# Patient Record
Sex: Male | Born: 1980 | Race: Black or African American | Hispanic: No | Marital: Single | Smoking: Current every day smoker
Health system: Southern US, Community
[De-identification: ages and names within clinical notes are randomized; demographics above are authoritative.]

---

## 2005-03-10 ENCOUNTER — Emergency Department: Payer: Self-pay | Admitting: Emergency Medicine

## 2005-08-30 ENCOUNTER — Emergency Department: Payer: Self-pay | Admitting: Emergency Medicine

## 2007-10-04 ENCOUNTER — Emergency Department: Payer: Self-pay | Admitting: Unknown Physician Specialty

## 2009-06-06 ENCOUNTER — Emergency Department: Payer: Self-pay | Admitting: Emergency Medicine

## 2011-05-25 ENCOUNTER — Emergency Department: Payer: Self-pay | Admitting: Emergency Medicine

## 2014-12-04 ENCOUNTER — Emergency Department: Payer: Self-pay | Admitting: Emergency Medicine

## 2018-10-23 ENCOUNTER — Emergency Department: Payer: Managed Care, Other (non HMO)

## 2018-10-23 ENCOUNTER — Emergency Department
Admission: EM | Admit: 2018-10-23 | Discharge: 2018-10-23 | Disposition: A | Discharge status: Home / Self Care | Payer: Managed Care, Other (non HMO) | Attending: Emergency Medicine | Admitting: Emergency Medicine

## 2018-10-23 ENCOUNTER — Encounter: Payer: Self-pay | Admitting: Emergency Medicine

## 2018-10-23 DIAGNOSIS — R591 Generalized enlarged lymph nodes: Secondary | ICD-10-CM

## 2018-10-23 DIAGNOSIS — E876 Hypokalemia: Secondary | ICD-10-CM

## 2018-10-23 DIAGNOSIS — F17210 Nicotine dependence, cigarettes, uncomplicated: Secondary | ICD-10-CM | POA: Diagnosis not present

## 2018-10-23 DIAGNOSIS — R599 Enlarged lymph nodes, unspecified: Secondary | ICD-10-CM | POA: Insufficient documentation

## 2018-10-23 DIAGNOSIS — M542 Cervicalgia: Secondary | ICD-10-CM | POA: Diagnosis present

## 2018-10-23 DIAGNOSIS — J449 Chronic obstructive pulmonary disease, unspecified: Secondary | ICD-10-CM | POA: Diagnosis not present

## 2018-10-23 DIAGNOSIS — J984 Other disorders of lung: Secondary | ICD-10-CM

## 2018-10-23 LAB — CBC
HCT: 41.3 % (ref 39.0–52.0)
Hemoglobin: 14.4 g/dL (ref 13.0–17.0)
MCH: 31.9 pg (ref 26.0–34.0)
MCHC: 34.9 g/dL (ref 30.0–36.0)
MCV: 91.6 fL (ref 80.0–100.0)
NRBC: 0 % (ref 0.0–0.2)
Platelets: 237 10*3/uL (ref 150–400)
RBC: 4.51 MIL/uL (ref 4.22–5.81)
RDW: 14.6 % (ref 11.5–15.5)
WBC: 9.2 10*3/uL (ref 4.0–10.5)

## 2018-10-23 LAB — BASIC METABOLIC PANEL
Anion gap: 8 (ref 5–15)
BUN: 10 mg/dL (ref 6–20)
CO2: 28 mmol/L (ref 22–32)
Calcium: 9.3 mg/dL (ref 8.9–10.3)
Chloride: 102 mmol/L (ref 98–111)
Creatinine, Ser: 0.91 mg/dL (ref 0.61–1.24)
Glucose, Bld: 96 mg/dL (ref 70–99)
Potassium: 3.4 mmol/L — ABNORMAL LOW (ref 3.5–5.1)
Sodium: 138 mmol/L (ref 135–145)

## 2018-10-23 LAB — GROUP A STREP BY PCR: Group A Strep by PCR: NOT DETECTED

## 2018-10-23 LAB — INFLUENZA PANEL BY PCR (TYPE A & B)
Influenza A By PCR: NEGATIVE
Influenza B By PCR: NEGATIVE

## 2018-10-23 MED ORDER — PREDNISONE 10 MG PO TABS: ORAL_TABLET | ORAL | 0 refills | Status: DC

## 2018-10-23 MED ORDER — POTASSIUM CHLORIDE CRYS ER 20 MEQ PO TBCR
40.0000 meq | EXTENDED_RELEASE_TABLET | Freq: Once | ORAL | Status: AC
  Administered 2018-10-23: 40 meq via ORAL
  Filled 2018-10-23: qty 2

## 2018-10-23 MED ORDER — AMOXICILLIN-POT CLAVULANATE 875-125 MG PO TABS: 1.0000 | ORAL_TABLET | Freq: Two times a day (BID) | ORAL | 0 refills | Status: AC

## 2018-10-23 MED ORDER — IOHEXOL 300 MG/ML  SOLN
75.0000 mL | Freq: Once | INTRAMUSCULAR | Status: AC | PRN
  Administered 2018-10-23: 75 mL via INTRAVENOUS
  Filled 2018-10-23: qty 75

## 2018-10-23 NOTE — ED Triage Notes (Signed)
Patient presents to the ED with neck pain x 2 days.  Patient states pain is on the left side of his neck and he feels area is swollen and warm.  This RN palpated neck but it was not super obvious.  Patient is in no obvious distress at this time.  Patient states he has pain when he swallows and when he palpates neck.

## 2018-10-23 NOTE — ED Notes (Addendum)
See triage note  Presents with pain to neck for about 3 days  Denies any injury or trauma   States woke up with pain  States pain is mainly to left side of neck  Feels warm to touch per pt  Low grade feer on arrival   States he took some tylenol PTA feels better

## 2018-10-23 NOTE — ED Provider Notes (Signed)
[facility] Emergency Department Provider Note  ____________________________________________  Time seen: Approximately 4:22 PM  I have reviewed the triage vital signs and the nursing notes.   HISTORY  Chief Complaint Neck Pain    HPI Ivan Coleman is a 37 y.o. male that presents emergency department for evaluation of left-sided neck pain for 2 days.  He says that neck is felt warm and swollen.  Patient states that pain started when he woke up yesterday morning.  He states that pain is worse with swallowing.  He had pain in his throat with swallowing earlier today but that resolved with Tylenol.  He is not sure if he has had a fever but has had chills so he has been wearing a jacket.  Pain is worse when he moves his head in any direction.  He has had on and off headache all day.  History reviewed. No pertinent past medical history.  There are no active problems to display for this patient.   History reviewed. No pertinent surgical history.  Prior to Admission medications   Medication Sig Start Date End Date Taking? Authorizing Provider  amoxicillin-clavulanate (AUGMENTIN) 875-125 MG tablet Take 1 tablet by mouth 2 (two) times daily for 10 days. 10/23/18 11/02/18  Enid Derry, PA-C  predniSONE (DELTASONE) 10 MG tablet Take 6 tablets on day 1, take 5 tablets on day 2, take 4 tablets on day 3, take 3 tablets on day 4, take 2 tablets on day 5, take 1 tablet on day 6 10/23/18   Enid Derry, PA-C    Allergies Patient has no known allergies.  No family history on file.  Social History Social History   Tobacco Use  . Smoking status: Current Every Day Smoker    Packs/day: 0.50    Types: Cigarettes  . Smokeless tobacco: Never Used  Substance Use Topics  . Alcohol use: Not Currently  . Drug use: Not on file     Review of Systems  Constitutional: Positive for chills. ENT: No upper respiratory complaints. Cardiovascular: No chest pain. Respiratory:  No cough. No SOB. Gastrointestinal: No abdominal pain.  No nausea, no vomiting.  Musculoskeletal: Positive for neck pain. Skin: Negative for rash, abrasions, lacerations, ecchymosis. Neurological: Negative for headaches, numbness or tingling   ____________________________________________   PHYSICAL EXAM:  VITAL SIGNS: ED Triage Vitals  Enc Vitals Group     BP 10/23/18 1546 (!) 145/100     Pulse Rate 10/23/18 1546 85     Resp 10/23/18 1546 18     Temp 10/23/18 1546 99 F (37.2 C)     Temp Source 10/23/18 1546 Oral     SpO2 10/23/18 1546 99 %     Weight 10/23/18 1545 135 lb (61.2 kg)     Height 10/23/18 1545 5\' 1"  (1.549 m)     Head Circumference --      Peak Flow --      Pain Score 10/23/18 1545 6     Pain Loc --      Pain Edu? --      Excl. in GC? --      Constitutional: Alert and oriented. Well appearing and in no acute distress. Eyes: Conjunctivae are normal. PERRL. EOMI. Head: Atraumatic. ENT:      Ears:      Nose: No congestion/rhinnorhea.      Mouth/Throat: Mucous membranes are moist.  No facial swelling. Neck: No stridor.  No cervical spine tenderness to palpation.  Tenderness to palpation over left  sternocleidomastoid.  No palpable lymph node.  Pain with range of motion of neck.  No bruits. Cardiovascular: Normal rate, regular rhythm.  Good peripheral circulation. Respiratory: Normal respiratory effort without tachypnea or retractions. Lungs CTAB. Good air entry to the bases with no decreased or absent breath sounds. Musculoskeletal: Full range of motion to all extremities. No gross deformities appreciated.  No pain elicited with resisted flexion and extension of shoulders. Neurologic:  Normal speech and language. No gross focal neurologic deficits are appreciated.  Skin:  Skin is warm, dry and intact. No rash noted. Psychiatric: Mood and affect are normal. Speech and behavior are normal. Patient exhibits appropriate insight and  judgement.   ____________________________________________   LABS (all labs ordered are listed, but only abnormal results are displayed)  Labs Reviewed  BASIC METABOLIC PANEL - Abnormal; Notable for the following components:      Result Value   Potassium 3.4 (*)    All other components within normal limits  GROUP A STREP BY PCR  INFLUENZA PANEL BY PCR (TYPE A & B)  CBC   ____________________________________________  EKG   ____________________________________________  RADIOLOGY   Ct Soft Tissue Neck W Contrast  Result Date: 10/23/2018 CLINICAL DATA:  37 y/o M; 2 days of left-sided neck pain with swelling and warmth. EXAM: CT NECK WITH CONTRAST TECHNIQUE: Multidetector CT imaging of the neck was performed using the standard protocol following the bolus administration of intravenous contrast. CONTRAST:  75mL OMNIPAQUE IOHEXOL 300 MG/ML  SOLN COMPARISON:  None. FINDINGS: Pharynx and larynx: Normal. No mass or swelling. Salivary glands: No inflammation, mass, or stone. Thyroid: Normal. Lymph nodes: Mild left upper and lower cervical lymphadenopathy with surrounding fat stranding. No lymph node necrosis. Vascular: Negative. Limited intracranial: Negative. Visualized orbits: Negative. Mastoids and visualized paranasal sinuses: Clear. Skeleton: No acute or aggressive process. Upper chest: Numerous centrilobular ground-glass nodules in the lung apices. Biapical bulla. Other: Dental disease with periapical cysts of maxillary molars. Debris within the external auditory canals, likely cerumen. IMPRESSION: 1. Mild left upper and lower cervical lymphadenopathy with surrounding fat stranding. No abscess or lymph node necrosis. Findings likely represent reactive lymphadenitis. Differential includes, but is not limited to, autoimmune disease, lymphoproliferative disorder such as lymphoma, or atypical infection such as tuberculosis or cat scratch disease. Clinical correlation and follow-up to resolution is  recommended. 2. Numerous centrilobular ground-glass nodules in the lung apices, likely smoking related respiratory bronchiolitis. Electronically Signed   By: Mitzi Hansen M.D.   On: 10/23/2018 18:00    ____________________________________________    PROCEDURES  Procedure(s) performed:    Procedures    Medications  iohexol (OMNIPAQUE) 300 MG/ML solution 75 mL (75 mLs Intravenous Contrast Given 10/23/18 1729)  potassium chloride SA (K-DUR,KLOR-CON) CR tablet 40 mEq (40 mEq Oral Given 10/23/18 1910)     ____________________________________________   INITIAL IMPRESSION / ASSESSMENT AND PLAN / ED COURSE  Pertinent labs & imaging results that were available during my care of the patient were reviewed by me and considered in my medical decision making (see chart for details).  Review of the [place] CSRS was performed in accordance of the [facility] prior to dispensing any controlled drugs.     Patient's diagnosis is consistent with lymphadenopathy.  Vital signs and exam are reassuring.  CMP remarkable for potassium of 3.4, which was supplemented.  CT neck soft tissue indicates nonspecific lymphadenitis.  Patient denies any additional symptoms.  He will be given a prescription for Augmentin and prednisone to trial.  CT shows groundglass  nodules that patient will follow up with primary care about.  Patient is agreeable to follow-up with primary care for further work-up of lymphadenitis.  Patient will be discharged home with prescriptions for Augmentin and prednisone. Patient is to follow up with primary care as directed. Patient is given ED precautions to return to the ED for any worsening or new symptoms.     ____________________________________________  FINAL CLINICAL IMPRESSION(S) / ED DIAGNOSES  Final diagnoses:  Lymphadenopathy  Lung disease  Hypokalemia      NEW MEDICATIONS STARTED DURING THIS VISIT:  ED Discharge Orders         Ordered    amoxicillin-clavulanate  (AUGMENTIN) 875-125 MG tablet  2 times daily     10/23/18 1914    predniSONE (DELTASONE) 10 MG tablet     10/23/18 1914              This chart was dictated using voice recognition software/Dragon. Despite best efforts to proofread, errors can occur which can change the meaning. Any change was purely unintentional.    Enid DerryWagner, Saira Kramme, PA-C 10/24/18 Deniece Portela0020    [place], [place], MD 10/24/18 [phone]

## 2018-10-23 NOTE — Discharge Instructions (Addendum)
Please make an appointment with primary care for follow-up of lymphadenopathy and to ensure resolution.

## 2018-10-23 NOTE — ED Notes (Signed)
Pt discharged home after verbalizing understanding of discharge instructions; nad noted. 

## 2020-02-26 ENCOUNTER — Emergency Department: Payer: BC Managed Care – PPO

## 2020-02-26 ENCOUNTER — Other Ambulatory Visit: Payer: Self-pay

## 2020-02-26 ENCOUNTER — Emergency Department
Admission: EM | Admit: 2020-02-26 | Discharge: 2020-02-26 | Disposition: A | Discharge status: Home / Self Care | Payer: BC Managed Care – PPO | Attending: Emergency Medicine | Admitting: Emergency Medicine

## 2020-02-26 DIAGNOSIS — R1032 Left lower quadrant pain: Secondary | ICD-10-CM | POA: Diagnosis not present

## 2020-02-26 DIAGNOSIS — F17210 Nicotine dependence, cigarettes, uncomplicated: Secondary | ICD-10-CM | POA: Insufficient documentation

## 2020-02-26 DIAGNOSIS — G8929 Other chronic pain: Secondary | ICD-10-CM | POA: Diagnosis not present

## 2020-02-26 DIAGNOSIS — M25511 Pain in right shoulder: Secondary | ICD-10-CM | POA: Insufficient documentation

## 2020-02-26 DIAGNOSIS — R1030 Lower abdominal pain, unspecified: Secondary | ICD-10-CM

## 2020-02-26 LAB — COMPREHENSIVE METABOLIC PANEL
ALT: 14 U/L (ref 0–44)
AST: 14 U/L — ABNORMAL LOW (ref 15–41)
Albumin: 4.4 g/dL (ref 3.5–5.0)
Alkaline Phosphatase: 62 U/L (ref 38–126)
Anion gap: 10 (ref 5–15)
BUN: 11 mg/dL (ref 6–20)
CO2: 26 mmol/L (ref 22–32)
Calcium: 9.5 mg/dL (ref 8.9–10.3)
Chloride: 102 mmol/L (ref 98–111)
Creatinine, Ser: 0.97 mg/dL (ref 0.61–1.24)
GFR calc Af Amer: >60 mL/min (ref >60)
GFR calc non Af Amer: >60 mL/min (ref >60)
Glucose, Bld: 97 mg/dL (ref 70–99)
Potassium: 3.8 mmol/L (ref 3.5–5.1)
Sodium: 138 mmol/L (ref 135–145)
Total Bilirubin: 0.8 mg/dL (ref 0.3–1.2)
Total Protein: 8.4 g/dL — ABNORMAL HIGH (ref 6.5–8.1)

## 2020-02-26 LAB — CBC
HCT: 44.4 % (ref 39.0–52.0)
Hemoglobin: 15.3 g/dL (ref 13.0–17.0)
MCH: 31.5 pg (ref 26.0–34.0)
MCHC: 34.5 g/dL (ref 30.0–36.0)
MCV: 91.5 fL (ref 80.0–100.0)
Platelets: 309 10*3/uL (ref 150–400)
RBC: 4.85 MIL/uL (ref 4.22–5.81)
RDW: 14.5 % (ref 11.5–15.5)
WBC: 9 10*3/uL (ref 4.0–10.5)
nRBC: 0 % (ref 0.0–0.2)

## 2020-02-26 LAB — LIPASE, BLOOD: Lipase: 22 U/L (ref 11–51)

## 2020-02-26 LAB — URINALYSIS, COMPLETE (UACMP) WITH MICROSCOPIC
Bacteria, UA: NONE SEEN
Bilirubin Urine: NEGATIVE
Glucose, UA: NEGATIVE mg/dL
Hgb urine dipstick: NEGATIVE
Ketones, ur: NEGATIVE mg/dL
Nitrite: NEGATIVE
Protein, ur: NEGATIVE mg/dL
Specific Gravity, Urine: 1.026 (ref 1.005–1.030)
pH: 7 (ref 5.0–8.0)

## 2020-02-26 MED ORDER — IBUPROFEN 800 MG PO TABS: 800.0000 mg | ORAL_TABLET | Freq: Three times a day (TID) | ORAL | 0 refills | Status: DC | PRN

## 2020-02-26 MED ORDER — IBUPROFEN 800 MG PO TABS
800.0000 mg | ORAL_TABLET | Freq: Once | ORAL | Status: AC
  Administered 2020-02-26: 800 mg via ORAL
  Filled 2020-02-26: qty 1

## 2020-02-26 MED ORDER — SODIUM CHLORIDE 0.9% FLUSH: 3.0000 mL | Freq: Once | INTRAVENOUS | Status: DC

## 2020-02-26 MED ORDER — DIAZEPAM 5 MG PO TABS: 5.0000 mg | ORAL_TABLET | Freq: Three times a day (TID) | ORAL | 0 refills | Status: DC | PRN

## 2020-02-26 NOTE — ED Provider Notes (Signed)
[facility] Emergency Department Provider Note       Time seen: ----------------------------------------- 10:47 AM on 02/26/2020 -----------------------------------------   I have reviewed the triage vital signs and the nursing notes.  HISTORY   Chief Complaint Shoulder Pain and Abdominal Pain   HPI Ivan Coleman is a 39 y.o. male with no past medical history who presents to the ED for right shoulder pain that he has had intermittently for several years that flared up last week.  Initially it started after he tried to dunk a basketball.  He is also having some lower abdominal pain without nausea, vomiting or diarrhea.  History reviewed. No pertinent past medical history.  There are no problems to display for this patient.   History reviewed. No pertinent surgical history.  Allergies Patient has no known allergies.  Social History Social History   Tobacco Use  . Smoking status: Current Every Day Smoker    Packs/day: 0.50    Types: Cigarettes  . Smokeless tobacco: Never Used  Substance Use Topics  . Alcohol use: Not Currently  . Drug use: Not on file    Review of Systems Constitutional: Negative for fever. Cardiovascular: Negative for chest pain. Respiratory: Negative for shortness of breath. Gastrointestinal: Positive for abdominal pain Musculoskeletal: Positive for right shoulder pain Skin: Negative for rash. Neurological: Negative for headaches, focal weakness or numbness.  All systems negative/normal/unremarkable except as stated in the HPI  ____________________________________________   PHYSICAL EXAM:  VITAL SIGNS: ED Triage Vitals  Enc Vitals Group     BP 02/26/20 0806 134/89     Pulse Rate 02/26/20 0806 88     Resp 02/26/20 0806 16     Temp 02/26/20 0806 98.5 F (36.9 C)     Temp Source 02/26/20 0806 Oral     SpO2 02/26/20 0806 99 %     Weight 02/26/20 0808 150 lb (68 kg)     Height 02/26/20 0808 5\' 1"  (1.549 m)      Head Circumference --      Peak Flow --      Pain Score 02/26/20 0808 6     Pain Loc --      Pain Edu? --      Excl. in GC? --     Constitutional: Alert and oriented. Well appearing and in no distress. Eyes: Conjunctivae are normal. Normal extraocular movements. ENT      Head: Normocephalic and atraumatic.      Nose: No congestion/rhinnorhea.      Mouth/Throat: Mucous membranes are moist.      Neck: No stridor. Cardiovascular: Normal rate, regular rhythm. No murmurs, rubs, or gallops. Respiratory: Normal respiratory effort without tachypnea nor retractions. Breath sounds are clear and equal bilaterally. No wheezes/rales/rhonchi. Gastrointestinal: Left lower quadrant tenderness, no rebound or guarding.  Normal bowel sounds. Musculoskeletal: Mild pain with range of motion of the right shoulder Neurologic:  Normal speech and language. No gross focal neurologic deficits are appreciated.  Skin:  Skin is warm, dry and intact. No rash noted. Psychiatric: Mood and affect are normal. Speech and behavior are normal.  ___________________________________________  ED COURSE:  As part of my medical decision making, I reviewed the following data within the electronic MEDICAL RECORD NUMBER History obtained from family if available, nursing notes, old chart and ekg, as well as notes from prior ED visits. Patient presented for shoulder pain as well as abdominal pain, we will assess with labs and imaging as indicated at this time.  Procedures  Ivan Coleman was evaluated in Emergency Department on 02/26/2020 for the symptoms described in the history of present illness. He was evaluated in the context of the global COVID-19 pandemic, which necessitated consideration that the patient might be at risk for infection with the SARS-CoV-2 virus that causes COVID-19. Institutional protocols and algorithms that pertain to the evaluation of patients at risk for COVID-19 are in a state of rapid change based on  information released by regulatory bodies including the CDC and federal and state organizations. These policies and algorithms were followed during the patient's care in the ED.  ____________________________________________   LABS (pertinent positives/negatives)  Labs Reviewed  COMPREHENSIVE METABOLIC PANEL - Abnormal; Notable for the following components:      Result Value   Total Protein 8.4 (*)    AST 14 (*)    All other components within normal limits  URINALYSIS, COMPLETE (UACMP) WITH MICROSCOPIC - Abnormal; Notable for the following components:   Color, Urine YELLOW (*)    APPearance HAZY (*)    Leukocytes,Ua SMALL (*)    All other components within normal limits  URINE CULTURE  LIPASE, BLOOD  CBC    RADIOLOGY Images were viewed by me  Right shoulder x-ray, CT renal protocol IMPRESSION: No urolithiasis or obstructive uropathy. IMPRESSION: Negative. ____________________________________________   DIFFERENTIAL DIAGNOSIS   Renal colic, UTI, pyelonephritis, muscle strain, arthritis, rotator cuff injury  FINAL ASSESSMENT AND PLAN  Shoulder pain, abdominal pain   Plan: The patient had presented for shoulder pain and abdominal pain that have both been intermittent. Patient's labs were reassuring. Patient's imaging did not reveal any acute process.  Patient has borderline evidence for UTI of uncertain significance.  We have sent a urine culture.  He will be discharged with NSAIDs, muscle relaxants and close outpatient follow-up.   Laurence Aly, MD    Note: This note was generated in part or whole with voice recognition software. Voice recognition is usually quite accurate but there are transcription errors that can and very often do occur. I apologize for any typographical errors that were not detected and corrected.     Earleen Newport, MD 02/26/20 1220

## 2020-02-26 NOTE — ED Triage Notes (Signed)
Pt c/o right shoulder pain flared up in the past week. Pt also c/o lower abd pain without N/V/D.Marland Kitchen

## 2020-02-27 LAB — URINE CULTURE: Culture: NO GROWTH

## 2020-10-04 ENCOUNTER — Emergency Department: Payer: 59

## 2020-10-04 ENCOUNTER — Other Ambulatory Visit: Payer: Self-pay

## 2020-10-04 ENCOUNTER — Emergency Department
Admission: EM | Admit: 2020-10-04 | Discharge: 2020-10-04 | Disposition: A | Discharge status: Home / Self Care | Payer: 59 | Attending: Student in an Organized Health Care Education/Training Program | Admitting: Student in an Organized Health Care Education/Training Program

## 2020-10-04 ENCOUNTER — Encounter: Payer: Self-pay | Admitting: Emergency Medicine

## 2020-10-04 DIAGNOSIS — H6123 Impacted cerumen, bilateral: Secondary | ICD-10-CM | POA: Insufficient documentation

## 2020-10-04 DIAGNOSIS — F17210 Nicotine dependence, cigarettes, uncomplicated: Secondary | ICD-10-CM | POA: Diagnosis not present

## 2020-10-04 DIAGNOSIS — R1032 Left lower quadrant pain: Secondary | ICD-10-CM | POA: Diagnosis present

## 2020-10-04 LAB — CBC WITH DIFFERENTIAL/PLATELET
Abs Immature Granulocytes: 0.02 10*3/uL (ref 0.00–0.07)
Basophils Absolute: 0.1 10*3/uL (ref 0.0–0.1)
Basophils Relative: 1 %
Eosinophils Absolute: 0.2 10*3/uL (ref 0.0–0.5)
Eosinophils Relative: 3 %
HCT: 40.7 % (ref 39.0–52.0)
Hemoglobin: 13.9 g/dL (ref 13.0–17.0)
Immature Granulocytes: 0 %
Lymphocytes Relative: 32 %
Lymphs Abs: 2.2 10*3/uL (ref 0.7–4.0)
MCH: 31.3 pg (ref 26.0–34.0)
MCHC: 34.2 g/dL (ref 30.0–36.0)
MCV: 91.7 fL (ref 80.0–100.0)
Monocytes Absolute: 0.7 10*3/uL (ref 0.1–1.0)
Monocytes Relative: 10 %
Neutro Abs: 3.7 10*3/uL (ref 1.7–7.7)
Neutrophils Relative %: 54 %
Platelets: 294 10*3/uL (ref 150–400)
RBC: 4.44 MIL/uL (ref 4.22–5.81)
RDW: 14.2 % (ref 11.5–15.5)
WBC: 6.9 10*3/uL (ref 4.0–10.5)
nRBC: 0 % (ref 0.0–0.2)

## 2020-10-04 LAB — COMPREHENSIVE METABOLIC PANEL
ALT: 15 U/L (ref 0–44)
AST: 16 U/L (ref 15–41)
Albumin: 4 g/dL (ref 3.5–5.0)
Alkaline Phosphatase: 62 U/L (ref 38–126)
Anion gap: 8 (ref 5–15)
BUN: 10 mg/dL (ref 6–20)
CO2: 28 mmol/L (ref 22–32)
Calcium: 9.1 mg/dL (ref 8.9–10.3)
Chloride: 101 mmol/L (ref 98–111)
Creatinine, Ser: 0.95 mg/dL (ref 0.61–1.24)
GFR, Estimated: >60 mL/min (ref >60)
Glucose, Bld: 103 mg/dL — ABNORMAL HIGH (ref 70–99)
Potassium: 4.1 mmol/L (ref 3.5–5.1)
Sodium: 137 mmol/L (ref 135–145)
Total Bilirubin: 0.6 mg/dL (ref 0.3–1.2)
Total Protein: 7.7 g/dL (ref 6.5–8.1)

## 2020-10-04 LAB — URINALYSIS, COMPLETE (UACMP) WITH MICROSCOPIC
Bacteria, UA: NONE SEEN
Bilirubin Urine: NEGATIVE
Glucose, UA: NEGATIVE mg/dL
Hgb urine dipstick: NEGATIVE
Ketones, ur: NEGATIVE mg/dL
Leukocytes,Ua: NEGATIVE
Nitrite: NEGATIVE
Protein, ur: NEGATIVE mg/dL
Specific Gravity, Urine: 1.021 (ref 1.005–1.030)
Squamous Epithelial / HPF: NONE SEEN (ref 0–5)
pH: 7 (ref 5.0–8.0)

## 2020-10-04 MED ORDER — ONDANSETRON HCL 4 MG/2ML IJ SOLN
4.0000 mg | Freq: Once | INTRAMUSCULAR | Status: AC
  Administered 2020-10-04: 4 mg via INTRAVENOUS
  Filled 2020-10-04: qty 2

## 2020-10-04 MED ORDER — MORPHINE SULFATE (PF) 4 MG/ML IV SOLN
4.0000 mg | INTRAVENOUS | Status: DC | PRN
  Administered 2020-10-04: 4 mg via INTRAVENOUS
  Filled 2020-10-04: qty 1

## 2020-10-04 MED ORDER — SODIUM CHLORIDE 0.9 % IV BOLUS
1000.0000 mL | Freq: Once | INTRAVENOUS | Status: AC
  Administered 2020-10-04: 1000 mL via INTRAVENOUS

## 2020-10-04 MED ORDER — CYCLOBENZAPRINE HCL 5 MG PO TABS: 5.0000 mg | ORAL_TABLET | Freq: Three times a day (TID) | ORAL | 0 refills | Status: DC | PRN

## 2020-10-04 MED ORDER — DEBROX 6.5 % OT SOLN: 5.0000 [drp] | Freq: Two times a day (BID) | OTIC | 0 refills | Status: AC

## 2020-10-04 NOTE — Discharge Instructions (Addendum)

## 2020-10-04 NOTE — ED Notes (Signed)
Pt given cup and is aware of need for urine sample

## 2020-10-04 NOTE — ED Notes (Signed)
Reminded pt of need for urine sample 

## 2020-10-04 NOTE — ED Triage Notes (Signed)
Pt to ER with c/o LLQ abdominal pain starting 3 days ago.  Pt denies n/v or other s/s at this time.

## 2020-10-04 NOTE — ED Notes (Signed)
Pt taken for CT 

## 2020-10-04 NOTE — ED Provider Notes (Signed)
[facility] Emergency Department Provider Note    First MD Initiated Contact with Patient 10/04/20 0840     (approximate)  I have reviewed the triage vital signs and the nursing notes.   HISTORY  Chief Complaint Abdominal Pain    HPI Ivan Coleman is a 39 y.o. male   presents to the ER for several days of left lower quadrant abdominal pain is worse with movement.  States he will get in a hot bath to try and help it.  Sometimes the pain will bit on his groin and sometimes just in the left side.  Does work lifting heavy buckets.  Denies any diarrhea.  No fevers or chills.  Is never had pain like this before.  No history of kidney stones.   History reviewed. No pertinent past medical history. History reviewed. No pertinent family history. History reviewed. No pertinent surgical history. There are no problems to display for this patient.     Prior to Admission medications   Medication Sig Start Date End Date Taking? Authorizing Provider  carbamide peroxide (DEBROX) 6.5 % OTIC solution Place 5 drops into both ears 2 (two) times daily. 10/04/20   Willy Eddy, MD  cyclobenzaprine (FLEXERIL) 5 MG tablet Take 1 tablet (5 mg total) by mouth 3 (three) times daily as needed for muscle spasms. 10/04/20   Willy Eddy, MD  diazepam (VALIUM) 5 MG tablet Take 1 tablet (5 mg total) by mouth every 8 (eight) hours as needed for muscle spasms. 02/26/20   Emily Filbert, MD  ibuprofen (ADVIL) 800 MG tablet Take 1 tablet (800 mg total) by mouth every 8 (eight) hours as needed. 02/26/20   Emily Filbert, MD    Allergies Patient has no known allergies.    Social History Social History   Tobacco Use  . Smoking status: Current Every Day Smoker    Packs/day: 0.50    Types: Cigarettes  . Smokeless tobacco: Never Used  Substance Use Topics  . Alcohol use: Not Currently  . Drug use: Not on file    Review of Systems Patient denies headaches,  rhinorrhea, blurry vision, numbness, shortness of breath, chest pain, edema, cough, abdominal pain, nausea, vomiting, diarrhea, dysuria, fevers, rashes or hallucinations unless otherwise stated above in HPI. ____________________________________________   PHYSICAL EXAM:  VITAL SIGNS: Vitals:   10/04/20 1130 10/04/20 1200  BP: 109/82 117/77  Pulse: (!) 51 (!) 56  Resp:  16  Temp:    SpO2: 100% 100%    Constitutional: Alert and oriented.  Eyes: Conjunctivae are normal.  Head: Atraumatic. Nose: No congestion/rhinnorhea. Mouth/Throat: Mucous membranes are moist.   Neck: No stridor. Painless ROM.  Cardiovascular: Normal rate, regular rhythm. Grossly normal heart sounds.  Good peripheral circulation. Respiratory: Normal respiratory effort.  No retractions. Lungs CTAB. Gastrointestinal: Soft with mild left lower quadrant abdominal pain no distention. No abdominal bruits. No CVA tenderness. Genitourinary: No hernia noted Musculoskeletal: No lower extremity tenderness nor edema.  No joint effusions. Neurologic:  Normal speech and language. No gross focal neurologic deficits are appreciated. No facial droop Skin:  Skin is warm, dry and intact. No rash noted. Psychiatric: Mood and affect are normal. Speech and behavior are normal.  ____________________________________________   LABS (all labs ordered are listed, but only abnormal results are displayed)  Results for orders placed or performed during the hospital encounter of 10/04/20 (from the past 24 hour(s))  CBC with Differential     Status: None   Collection Time:  10/04/20  9:13 AM  Result Value Ref Range   WBC 6.9 4.0 - 10.5 K/uL   RBC 4.44 4.22 - 5.81 MIL/uL   Hemoglobin 13.9 13.0 - 17.0 g/dL   HCT 97.9 39 - 52 %   MCV 91.7 80.0 - 100.0 fL   MCH 31.3 26.0 - 34.0 pg   MCHC 34.2 30.0 - 36.0 g/dL   RDW 89.2 11.9 - 41.7 %   Platelets 294 150 - 400 K/uL   nRBC 0.0 0.0 - 0.2 %   Neutrophils Relative % 54 %   Neutro Abs 3.7 1.7 -  7.7 K/uL   Lymphocytes Relative 32 %   Lymphs Abs 2.2 0.7 - 4.0 K/uL   Monocytes Relative 10 %   Monocytes Absolute 0.7 0.1 - 1.0 K/uL   Eosinophils Relative 3 %   Eosinophils Absolute 0.2 0.0 - 0.5 K/uL   Basophils Relative 1 %   Basophils Absolute 0.1 0.0 - 0.1 K/uL   Immature Granulocytes 0 %   Abs Immature Granulocytes 0.02 0.00 - 0.07 K/uL  Comprehensive metabolic panel     Status: Abnormal   Collection Time: 10/04/20  9:13 AM  Result Value Ref Range   Sodium 137 135 - 145 mmol/L   Potassium 4.1 3.5 - 5.1 mmol/L   Chloride 101 98 - 111 mmol/L   CO2 28 22 - 32 mmol/L   Glucose, Bld 103 (H) 70 - 99 mg/dL   BUN 10 6 - 20 mg/dL   Creatinine, Ser 4.08 0.61 - 1.24 mg/dL   Calcium 9.1 8.9 - 14.4 mg/dL   Total Protein 7.7 6.5 - 8.1 g/dL   Albumin 4.0 3.5 - 5.0 g/dL   AST 16 15 - 41 U/L   ALT 15 0 - 44 U/L   Alkaline Phosphatase 62 38 - 126 U/L   Total Bilirubin 0.6 0.3 - 1.2 mg/dL   GFR, Estimated >81 >85 mL/min   Anion gap 8 5 - 15  Urinalysis, Complete w Microscopic     Status: Abnormal   Collection Time: 10/04/20 10:56 AM  Result Value Ref Range   Color, Urine YELLOW (A) YELLOW   APPearance CLEAR (A) CLEAR   Specific Gravity, Urine 1.021 1.005 - 1.030   pH 7.0 5.0 - 8.0   Glucose, UA NEGATIVE NEGATIVE mg/dL   Hgb urine dipstick NEGATIVE NEGATIVE   Bilirubin Urine NEGATIVE NEGATIVE   Ketones, ur NEGATIVE NEGATIVE mg/dL   Protein, ur NEGATIVE NEGATIVE mg/dL   Nitrite NEGATIVE NEGATIVE   Leukocytes,Ua NEGATIVE NEGATIVE   RBC / HPF 0-5 0 - 5 RBC/hpf   WBC, UA 0-5 0 - 5 WBC/hpf   Bacteria, UA NONE SEEN NONE SEEN   Squamous Epithelial / LPF NONE SEEN 0 - 5   Mucus PRESENT    ____________________________________________ ____________________________________________  RADIOLOGY  I personally reviewed all radiographic images ordered to evaluate for the above acute complaints and reviewed radiology reports and findings.  These findings were personally discussed with the  patient.  Please see medical record for radiology report.  ____________________________________________   PROCEDURES  Procedure(s) performed:  Procedures    Critical Care performed: no ____________________________________________   INITIAL IMPRESSION / ASSESSMENT AND PLAN / ED COURSE  Pertinent labs & imaging results that were available during my care of the patient were reviewed by me and considered in my medical decision making (see chart for details).   DDX: Stone, hernia, musculoskeletal strain, UTI, diverticulitis, colitis  Ivan Coleman is a 39 y.o. who  presents to the ED with symptoms as described above.  Patient nontoxic-appearing is afebrile hemodynamically stable but is somewhat tender in left lower quadrant.  Will order blood work as well as imaging for the but differential will provide IV narcotic pain medication and IV antiemetic.  Clinical Course as of Oct 04 1521  Sat Oct 04, 2020  1157 Patient reassessed.  Abdominal exam is soft benign.  I suspect he has some sort of musculoskeletal injury from overuse injury at work.  Will provide prescription for muscle relaxant.  Also does appear to have bilateral cerumen impaction.  We discussed management of this.  I do not see indication for hospitalization he is otherwise well-appearing and appropriate for outpatient follow-up.   [PR]    Clinical Course User Index [PR] Willy Eddy, MD    The patient was evaluated in Emergency Department today for the symptoms described in the history of present illness. He/she was evaluated in the context of the global COVID-19 pandemic, which necessitated consideration that the patient might be at risk for infection with the SARS-CoV-2 virus that causes COVID-19. Institutional protocols and algorithms that pertain to the evaluation of patients at risk for COVID-19 are in a state of rapid change based on information released by regulatory bodies including the CDC and federal and state  organizations. These policies and algorithms were followed during the patient's care in the ED.  As part of my medical decision making, I reviewed the following data within the electronic MEDICAL RECORD NUMBER Nursing notes reviewed and incorporated, Labs reviewed, notes from prior ED visits and [place] Controlled Substance Database   ____________________________________________   FINAL CLINICAL IMPRESSION(S) / ED DIAGNOSES  Final diagnoses:  Abdominal pain, LLQ  Bilateral impacted cerumen      NEW MEDICATIONS STARTED DURING THIS VISIT:  Discharge Medication List as of 10/04/2020 12:00 PM    START taking these medications   Details  carbamide peroxide (DEBROX) 6.5 % OTIC solution Place 5 drops into both ears 2 (two) times daily., Starting Sat 10/04/2020, Normal    cyclobenzaprine (FLEXERIL) 5 MG tablet Take 1 tablet (5 mg total) by mouth 3 (three) times daily as needed for muscle spasms., Starting Sat 10/04/2020, Normal         Note:  This document was prepared using Dragon voice recognition software and may include unintentional dictation errors.    Willy Eddy, MD 10/04/20 [phone]

## 2020-11-16 ENCOUNTER — Encounter: Payer: Self-pay | Admitting: Emergency Medicine

## 2020-11-16 ENCOUNTER — Emergency Department
Admission: EM | Admit: 2020-11-16 | Discharge: 2020-11-16 | Disposition: A | Discharge status: Home / Self Care | Payer: 59 | Attending: Emergency Medicine | Admitting: Emergency Medicine

## 2020-11-16 ENCOUNTER — Other Ambulatory Visit: Payer: Self-pay

## 2020-11-16 DIAGNOSIS — R509 Fever, unspecified: Secondary | ICD-10-CM | POA: Diagnosis not present

## 2020-11-16 DIAGNOSIS — R300 Dysuria: Secondary | ICD-10-CM | POA: Insufficient documentation

## 2020-11-16 DIAGNOSIS — N50812 Left testicular pain: Secondary | ICD-10-CM | POA: Diagnosis not present

## 2020-11-16 DIAGNOSIS — R369 Urethral discharge, unspecified: Secondary | ICD-10-CM | POA: Insufficient documentation

## 2020-11-16 DIAGNOSIS — R1031 Right lower quadrant pain: Secondary | ICD-10-CM | POA: Insufficient documentation

## 2020-11-16 DIAGNOSIS — N50811 Right testicular pain: Secondary | ICD-10-CM | POA: Diagnosis not present

## 2020-11-16 DIAGNOSIS — Z5321 Procedure and treatment not carried out due to patient leaving prior to being seen by health care provider: Secondary | ICD-10-CM | POA: Diagnosis not present

## 2020-11-16 LAB — CBC
HCT: 38.6 % — ABNORMAL LOW (ref 39.0–52.0)
Hemoglobin: 13.4 g/dL (ref 13.0–17.0)
MCH: 31.3 pg (ref 26.0–34.0)
MCHC: 34.7 g/dL (ref 30.0–36.0)
MCV: 90.2 fL (ref 80.0–100.0)
Platelets: 321 10*3/uL (ref 150–400)
RBC: 4.28 MIL/uL (ref 4.22–5.81)
RDW: 13.8 % (ref 11.5–15.5)
WBC: 10.5 10*3/uL (ref 4.0–10.5)
nRBC: 0 % (ref 0.0–0.2)

## 2020-11-16 LAB — COMPREHENSIVE METABOLIC PANEL
ALT: 16 U/L (ref 0–44)
AST: 15 U/L (ref 15–41)
Albumin: 3.9 g/dL (ref 3.5–5.0)
Alkaline Phosphatase: 63 U/L (ref 38–126)
Anion gap: 10 (ref 5–15)
BUN: 9 mg/dL (ref 6–20)
CO2: 25 mmol/L (ref 22–32)
Calcium: 8.8 mg/dL — ABNORMAL LOW (ref 8.9–10.3)
Chloride: 99 mmol/L (ref 98–111)
Creatinine, Ser: 0.97 mg/dL (ref 0.61–1.24)
GFR, Estimated: >60 mL/min (ref >60)
Glucose, Bld: 117 mg/dL — ABNORMAL HIGH (ref 70–99)
Potassium: 3.3 mmol/L — ABNORMAL LOW (ref 3.5–5.1)
Sodium: 134 mmol/L — ABNORMAL LOW (ref 135–145)
Total Bilirubin: 0.5 mg/dL (ref 0.3–1.2)
Total Protein: 8.2 g/dL — ABNORMAL HIGH (ref 6.5–8.1)

## 2020-11-16 LAB — LACTIC ACID, PLASMA: Lactic Acid, Venous: 1.2 mmol/L (ref 0.5–1.9)

## 2020-11-16 LAB — URINALYSIS, COMPLETE (UACMP) WITH MICROSCOPIC
Bilirubin Urine: NEGATIVE
Glucose, UA: NEGATIVE mg/dL
Ketones, ur: NEGATIVE mg/dL
Nitrite: NEGATIVE
Protein, ur: 30 mg/dL — AB
Specific Gravity, Urine: 1.019 (ref 1.005–1.030)
Squamous Epithelial / HPF: NONE SEEN (ref 0–5)
WBC, UA: >50 WBC/hpf — ABNORMAL HIGH (ref 0–5)
pH: 6 (ref 5.0–8.0)

## 2020-11-16 LAB — LIPASE, BLOOD: Lipase: 20 U/L (ref 11–51)

## 2020-11-16 NOTE — ED Triage Notes (Signed)
Pt to ED via POV c/o painful urination. Pt states that he is also having pain in both of his testicles, and RLQ abd pain. Pt states that he also thinks he has some penile discharge. Pt states that he is having fever and chills as well. Pt is in NAD.

## 2021-07-17 IMAGING — CR DG SHOULDER 2+V*R*
3 series · 3 of 3 positions shown · non-contrast
Comparison: None.

CLINICAL DATA: Right-sided shoulder pain

EXAM:
RIGHT SHOULDER - 2+ VIEW

[shoulder grashey]
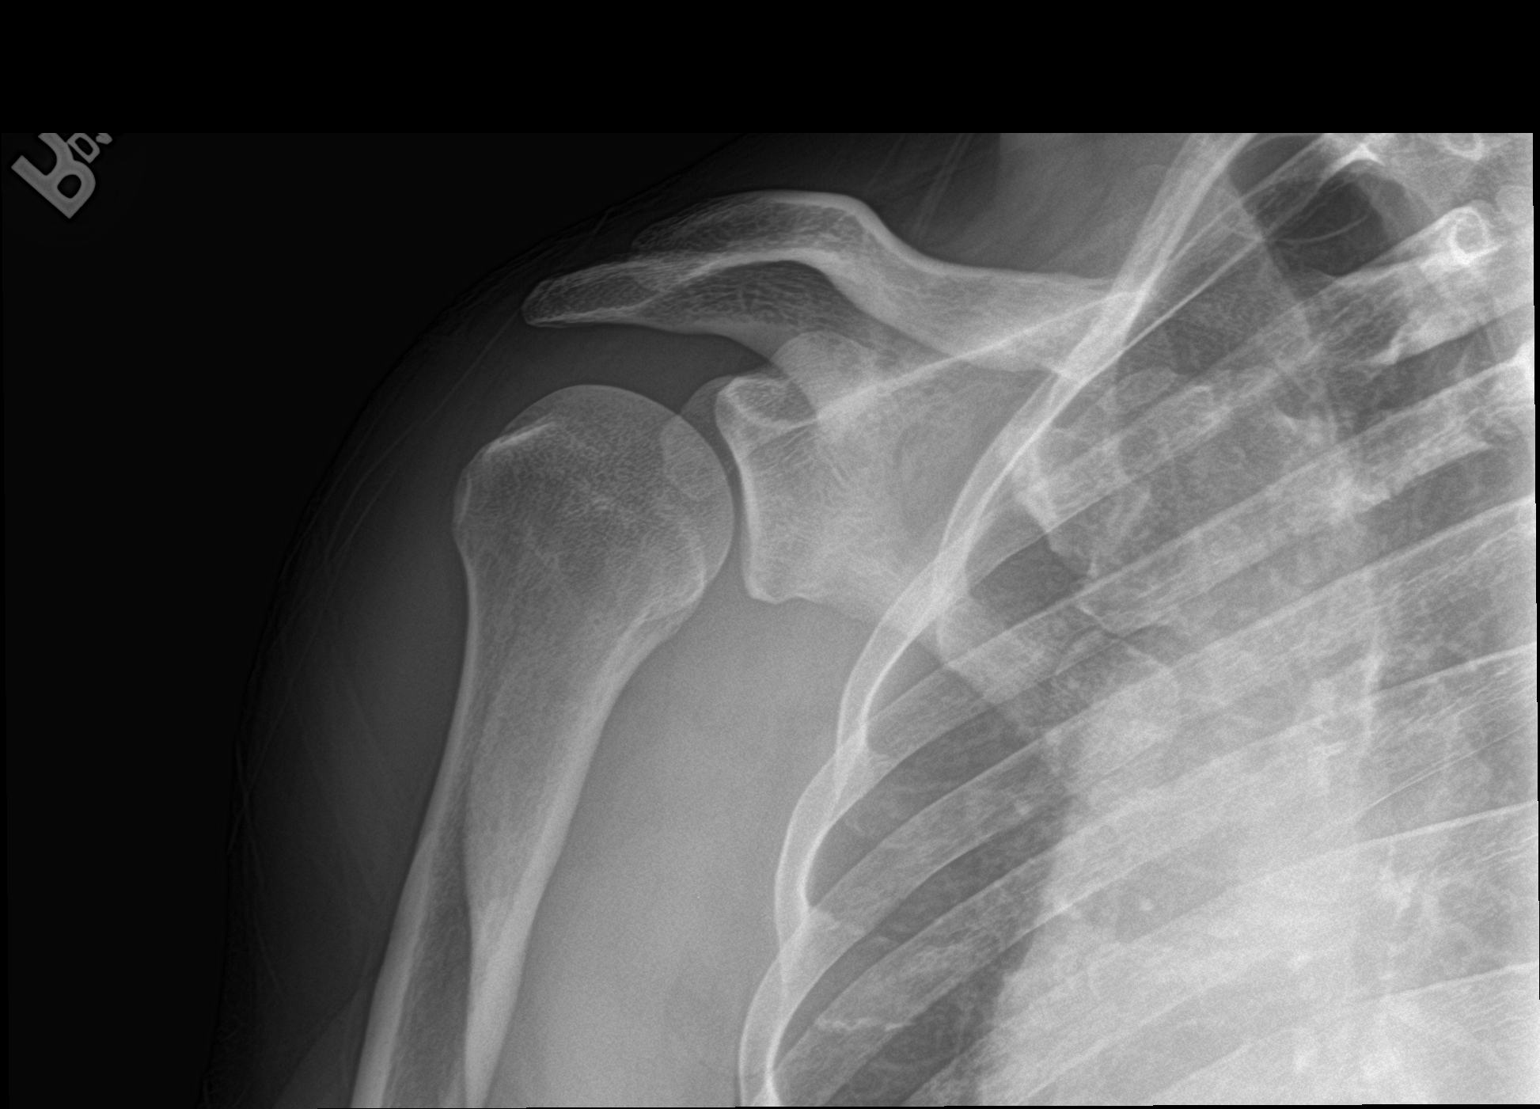

[shoulder axillary]
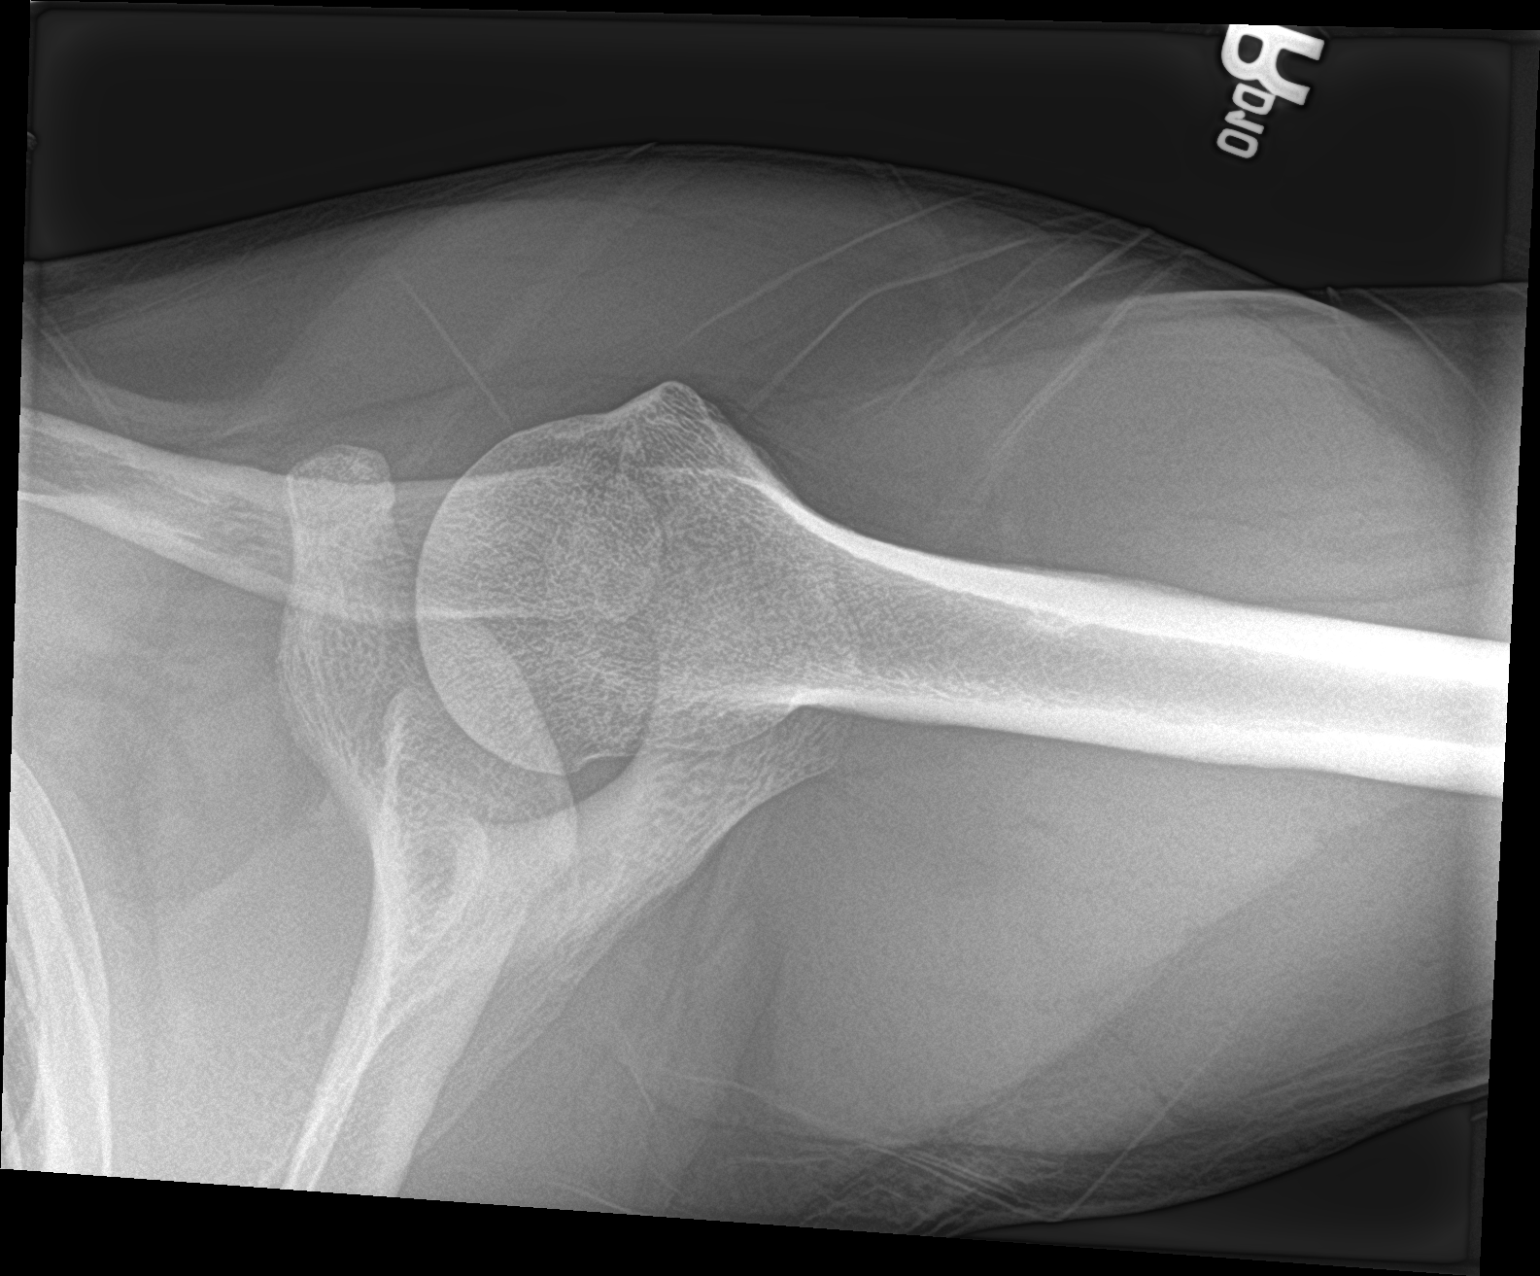

[shoulder y view]
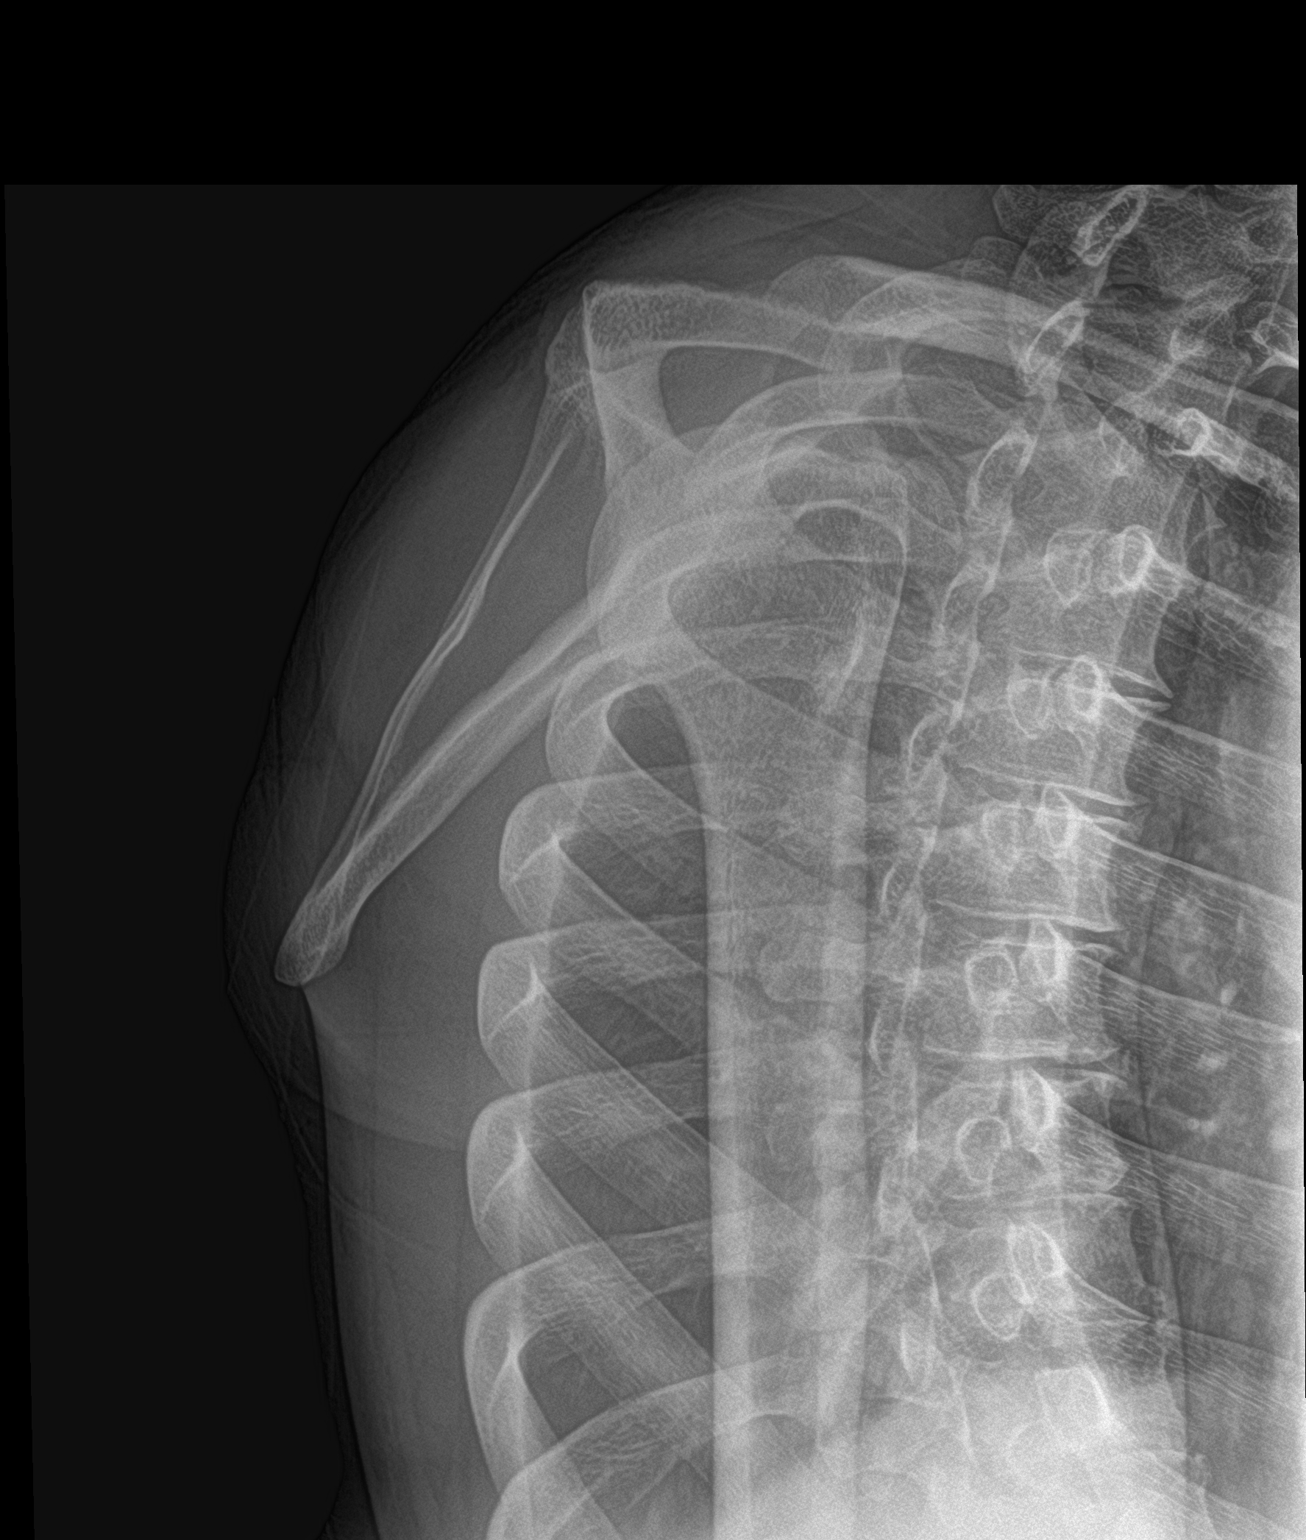

[3 of 3 positions shown; findings below may reference images not displayed]

FINDINGS: There is no evidence of fracture or dislocation. There is no
evidence of arthropathy or other focal bone abnormality. Soft
tissues are unremarkable.
IMPRESSION: Negative.

## 2022-02-23 IMAGING — CT CT ABD-PELV W/O CM
2 of 4 series · 15 of 46 positions shown, 17 images · non-contrast
Comparison: 02/26/2020

CLINICAL DATA: Flank pain and left lower quadrant pain.

EXAM:
CT ABDOMEN AND PELVIS WITHOUT CONTRAST
TECHNIQUE: Multidetector CT imaging of the abdomen and pelvis was performed
following the standard protocol without IV contrast.

[Series 2: routine abd/pel wo · axial · 0.69mm/px · z∈[-494,-54]mm · 12 of 98 slices shown, 14 images]
[im 5/98  soft-tissue]
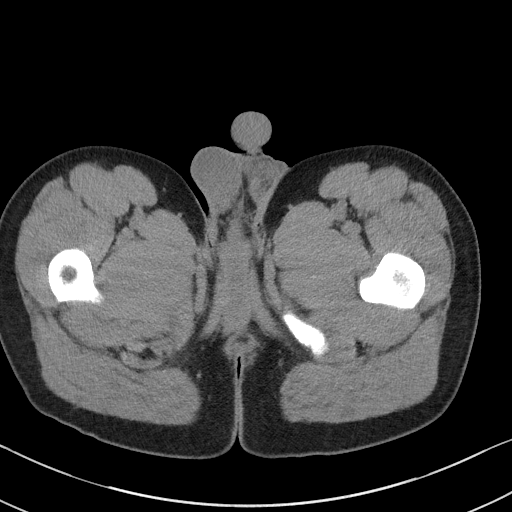
[im 5/98  bone]
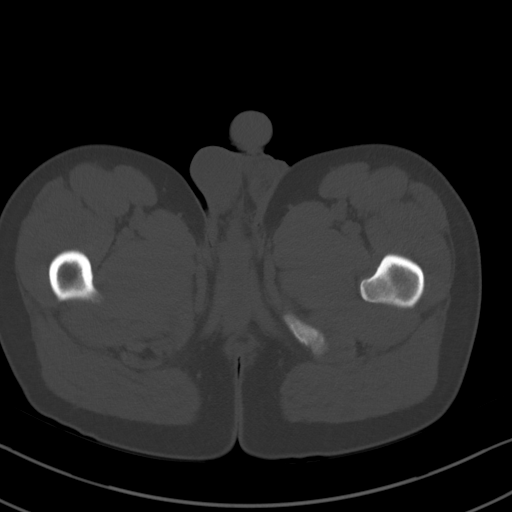
[im 13/98  soft-tissue]
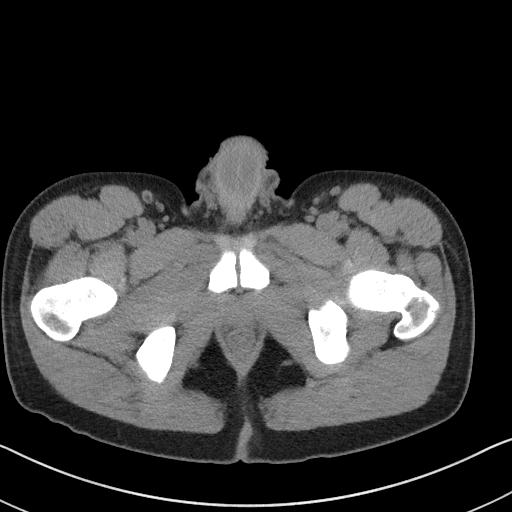
[im 22/98  soft-tissue]
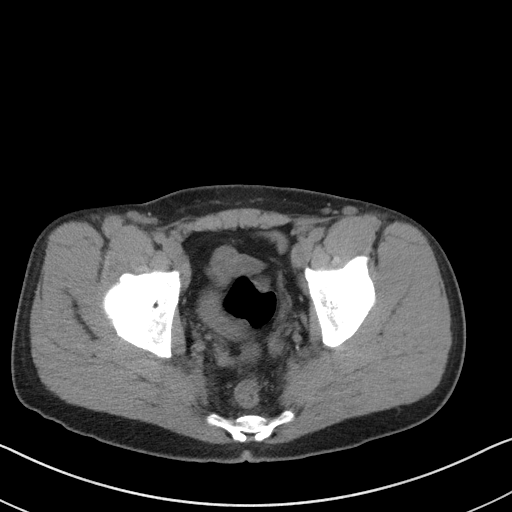
[im 30/98  soft-tissue]
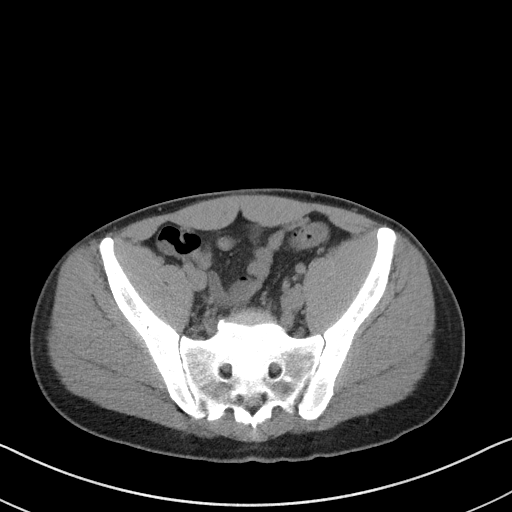
[im 38/98  soft-tissue]
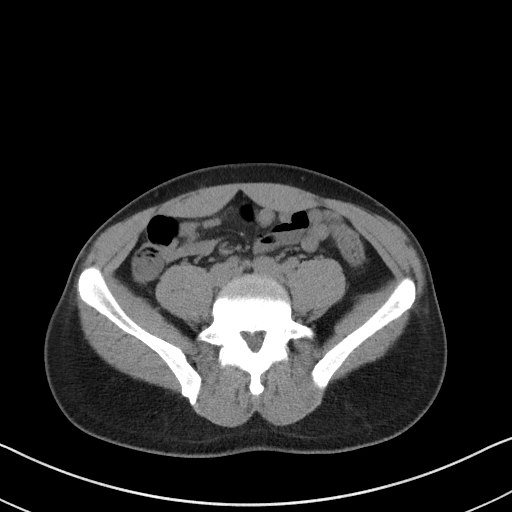
[im 47/98  soft-tissue]
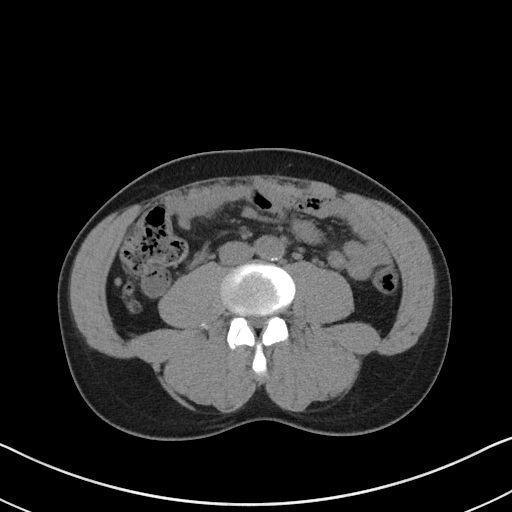
[im 51/98  soft-tissue]
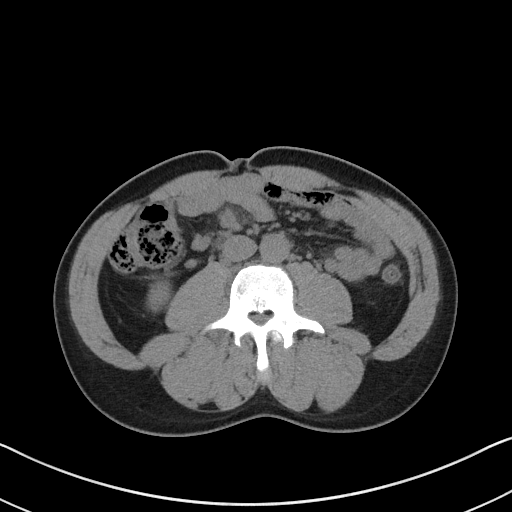
[im 60/98  soft-tissue]
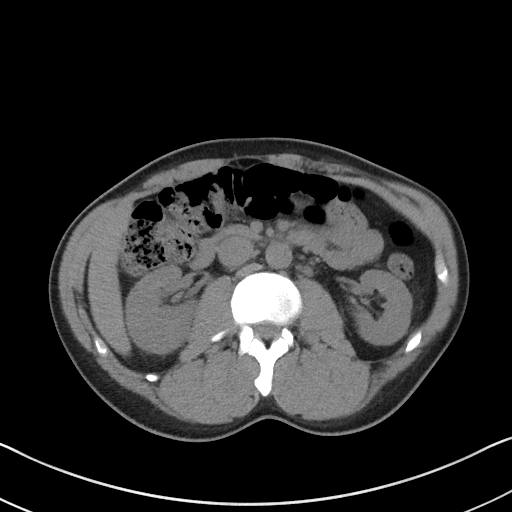
[im 68/98  soft-tissue]
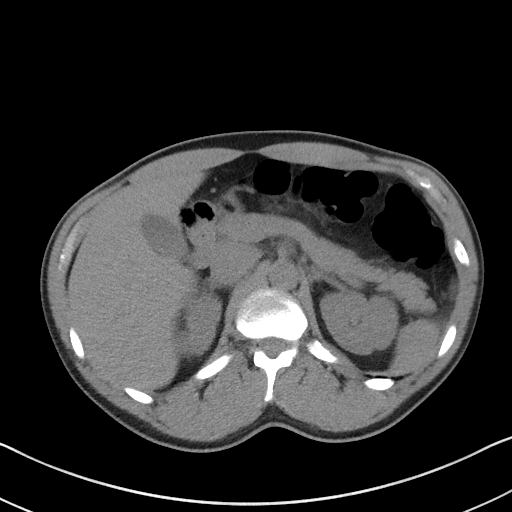
[im 68/98  bone]
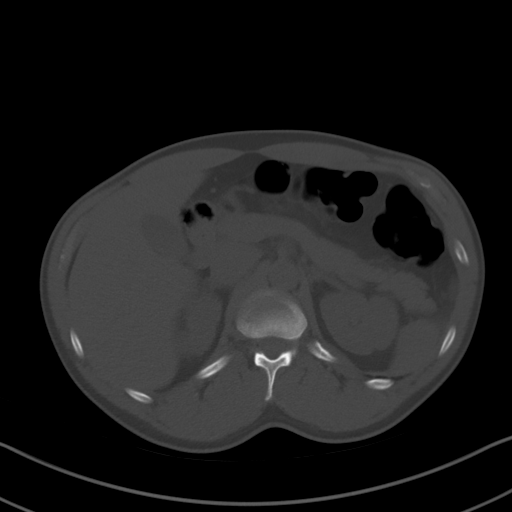
[im 76/98  soft-tissue]
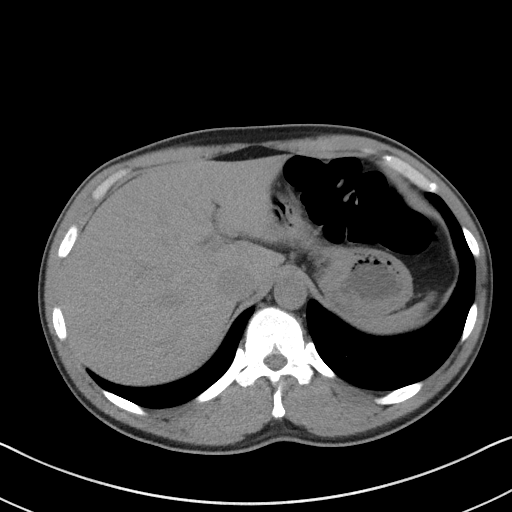
[im 85/98  soft-tissue]
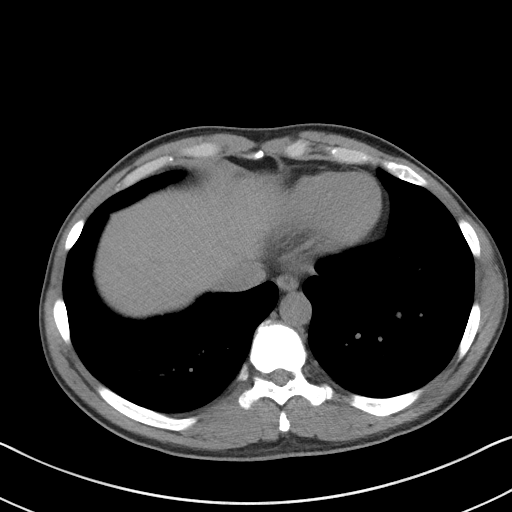
[im 93/98  soft-tissue]
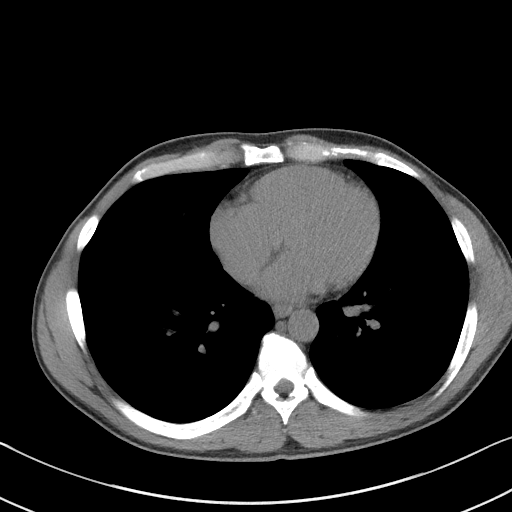

[Series 5: coronal st · coronal · 0.69mm/px · 3 of 79 slices shown]
[im 27/79  soft-tissue]
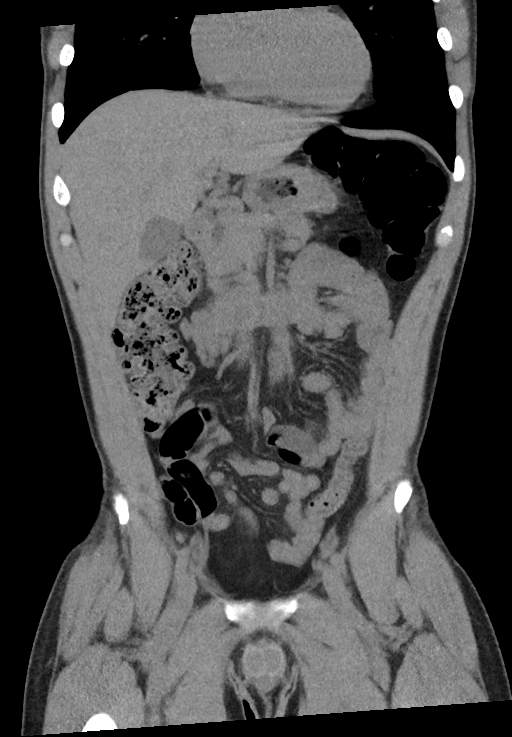
[im 35/79  soft-tissue]
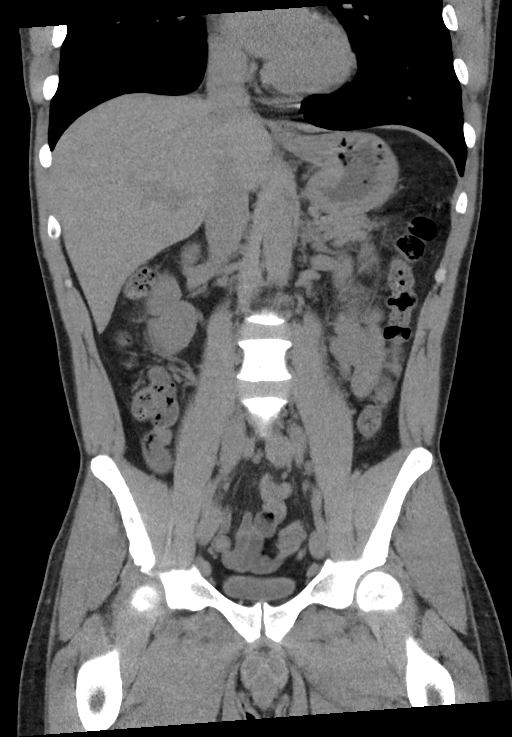
[im 44/79  soft-tissue]
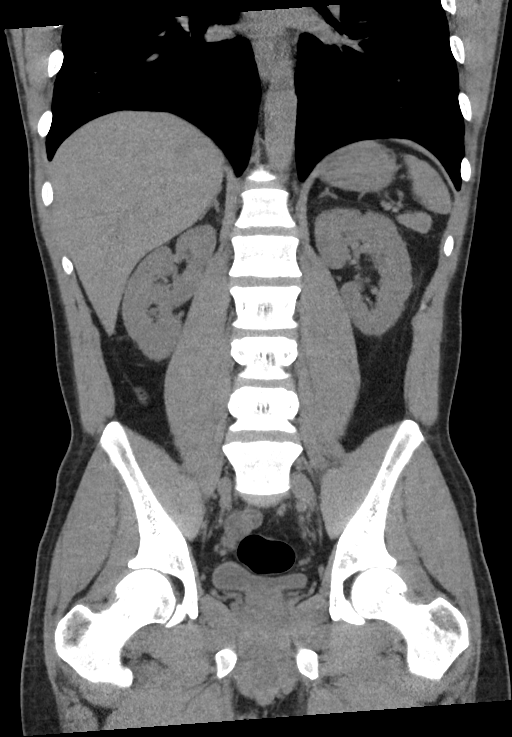

[15 of 46 positions shown; findings below may reference images not displayed]

FINDINGS: Lower chest: Unremarkable.

Hepatobiliary: No focal abnormality in the liver on this study
without intravenous contrast. There is no evidence for gallstones,
gallbladder wall thickening, or pericholecystic fluid. No
intrahepatic or extrahepatic biliary dilation.

Pancreas: No focal mass lesion. No dilatation of the main duct. No
intraparenchymal cyst. No peripancreatic edema.

Spleen: No splenomegaly. No focal mass lesion.

Adrenals/Urinary Tract: No adrenal nodule or mass. No stones are
seen in either kidney or ureter. No secondary changes in either
kidney or ureter. No bladder stones. Cortical scarring noted upper
pole right kidney with stable 6 mm hypoattenuating lesion in the
lower pole, too small to characterize but likely benign.

Stomach/Bowel: Stomach is unremarkable. No gastric wall thickening.
No evidence of outlet obstruction. Duodenum is normally positioned
as is the ligament of Treitz. No small bowel wall thickening. No
small bowel dilatation. The terminal ileum is normal. The appendix
is normal. No gross colonic mass. No colonic wall thickening.

Vascular/Lymphatic: There is abdominal aortic atherosclerosis
without aneurysm. There is no gastrohepatic or hepatoduodenal
ligament lymphadenopathy. No retroperitoneal or mesenteric
lymphadenopathy. No pelvic sidewall lymphadenopathy.

Reproductive: The prostate gland and seminal vesicles are
unremarkable.

Other: No intraperitoneal free fluid.

Musculoskeletal: No worrisome lytic or sclerotic osseous
abnormality.
IMPRESSION: 1. No acute findings in the abdomen or pelvis. Specifically, no
findings to explain the patient's history of left lower quadrant
pain. No urinary stone disease. No secondary changes in either
kidney or ureter.
2. Aortic Atherosclerosis ([id number]).

## 2022-05-26 ENCOUNTER — Encounter: Payer: Self-pay | Admitting: Medical Oncology

## 2022-05-26 ENCOUNTER — Emergency Department: Payer: Managed Care, Other (non HMO)

## 2022-05-26 ENCOUNTER — Emergency Department
Admission: EM | Admit: 2022-05-26 | Discharge: 2022-05-26 | Disposition: A | Discharge status: Home / Self Care | Payer: Managed Care, Other (non HMO) | Attending: Emergency Medicine | Admitting: Emergency Medicine

## 2022-05-26 DIAGNOSIS — R1012 Left upper quadrant pain: Secondary | ICD-10-CM | POA: Diagnosis not present

## 2022-05-26 DIAGNOSIS — M109 Gout, unspecified: Secondary | ICD-10-CM | POA: Insufficient documentation

## 2022-05-26 DIAGNOSIS — M79645 Pain in left finger(s): Secondary | ICD-10-CM | POA: Diagnosis present

## 2022-05-26 LAB — CBC WITH DIFFERENTIAL/PLATELET
Abs Immature Granulocytes: 0.02 10*3/uL (ref 0.00–0.07)
Basophils Absolute: 0 10*3/uL (ref 0.0–0.1)
Basophils Relative: 0 %
Eosinophils Absolute: 0.2 10*3/uL (ref 0.0–0.5)
Eosinophils Relative: 2 %
HCT: 43.2 % (ref 39.0–52.0)
Hemoglobin: 14.9 g/dL (ref 13.0–17.0)
Immature Granulocytes: 0 %
Lymphocytes Relative: 30 %
Lymphs Abs: 2.5 10*3/uL (ref 0.7–4.0)
MCH: 31.2 pg (ref 26.0–34.0)
MCHC: 34.5 g/dL (ref 30.0–36.0)
MCV: 90.4 fL (ref 80.0–100.0)
Monocytes Absolute: 0.6 10*3/uL (ref 0.1–1.0)
Monocytes Relative: 7 %
Neutro Abs: 5.1 10*3/uL (ref 1.7–7.7)
Neutrophils Relative %: 61 %
Platelets: 273 10*3/uL (ref 150–400)
RBC: 4.78 MIL/uL (ref 4.22–5.81)
RDW: 14.7 % (ref 11.5–15.5)
WBC: 8.4 10*3/uL (ref 4.0–10.5)
nRBC: 0 % (ref 0.0–0.2)

## 2022-05-26 LAB — BASIC METABOLIC PANEL
Anion gap: 7 (ref 5–15)
BUN: 13 mg/dL (ref 6–20)
CO2: 27 mmol/L (ref 22–32)
Calcium: 9.1 mg/dL (ref 8.9–10.3)
Chloride: 102 mmol/L (ref 98–111)
Creatinine, Ser: 1.12 mg/dL (ref 0.61–1.24)
GFR, Estimated: >60 mL/min (ref >60)
Glucose, Bld: 102 mg/dL — ABNORMAL HIGH (ref 70–99)
Potassium: 3.6 mmol/L (ref 3.5–5.1)
Sodium: 136 mmol/L (ref 135–145)

## 2022-05-26 LAB — SEDIMENTATION RATE: Sed Rate: 16 mm/hr — ABNORMAL HIGH (ref 0–15)

## 2022-05-26 LAB — URIC ACID: Uric Acid, Serum: 5.1 mg/dL (ref 3.7–8.6)

## 2022-05-26 LAB — C-REACTIVE PROTEIN: CRP: 2.8 mg/dL — ABNORMAL HIGH (ref <1.0)

## 2022-05-26 MED ORDER — PREDNISONE 20 MG PO TABS
40.0000 mg | ORAL_TABLET | Freq: Once | ORAL | Status: AC
  Administered 2022-05-26: 40 mg via ORAL
  Filled 2022-05-26: qty 2

## 2022-05-26 MED ORDER — PANTOPRAZOLE SODIUM 40 MG PO TBEC: 40.0000 mg | DELAYED_RELEASE_TABLET | Freq: Every day | ORAL | 0 refills | Status: AC

## 2022-05-26 MED ORDER — COLCHICINE 0.6 MG PO TABS
1.2000 mg | ORAL_TABLET | ORAL | Status: AC
  Administered 2022-05-26: 1.2 mg via ORAL
  Filled 2022-05-26: qty 2

## 2022-05-26 MED ORDER — COLCHICINE 0.6 MG PO TABS: 0.6000 mg | ORAL_TABLET | Freq: Every day | ORAL | 0 refills | Status: AC

## 2022-05-26 MED ORDER — ACETAMINOPHEN 500 MG PO TABS
1000.0000 mg | ORAL_TABLET | Freq: Once | ORAL | Status: AC
  Administered 2022-05-26: 1000 mg via ORAL
  Filled 2022-05-26: qty 2

## 2022-05-26 MED ORDER — PANTOPRAZOLE SODIUM 40 MG PO TBEC
40.0000 mg | DELAYED_RELEASE_TABLET | Freq: Once | ORAL | Status: AC
  Administered 2022-05-26: 40 mg via ORAL
  Filled 2022-05-26: qty 1

## 2022-05-26 MED ORDER — PREDNISONE 10 MG PO TABS: 40.0000 mg | ORAL_TABLET | Freq: Every day | ORAL | 0 refills | Status: AC

## 2022-05-26 NOTE — ED Notes (Signed)
Pt report left hand pain. Pt states "I think its gout." Pt denies taking any gout medication. Took IBU this morning with no relief.

## 2022-05-26 NOTE — ED Provider Triage Note (Signed)
Emergency Medicine Provider Triage Evaluation Note  Ivan Coleman , a 41 y.o. male  was evaluated in triage.  Pt complains of 4th finger pain that he woke up with today. No injuries. No wounds.   Review of Systems  Positive: Finger pain Negative: fever  Physical Exam  There were no vitals taken for this visit. Gen:   Awake, no distress   Resp:  Normal effort  MSK:   Moves extremities without difficulty  Other:  4th finger swollen, holding in flexion, pain with passive extension, tender throughout. No wounds  Medical Decision Making  Medically screening exam initiated at 11:17 AM.  Appropriate orders placed.  Ivan Coleman was informed that the remainder of the evaluation will be completed by another provider, this initial triage assessment does not replace that evaluation, and the importance of remaining in the ED until their evaluation is complete.     Jackelyn Hoehn, PA-C 05/26/22 1124

## 2022-05-26 NOTE — ED Provider Notes (Signed)
[facility] Provider Note    Event Date/Time   First MD Initiated Contact with Patient 05/26/22 1131     (approximate)   History   Hand Pain (Left ring finger)   HPI  Ivan Coleman is a 41 y.o. male with a past medical history of gout who presents for evaluation of some pain discomfort in his left third and fourth digits that he states started about 1/2 days ago.  States he was eating a lot of hamburgers prior to onset of symptoms.  He denies any recent alcohol.  He states it is very similar to gout pain he has had in his hands before.  He does not recall any injuries.  Denies any illegal drug use.  No recent penetrating trauma.  He is tried ibuprofen most recently earlier this morning and states he had a little bit discomfort in his left upper quadrant which he related to taking ibuprofen on empty stomach.  No other analgesia.  No other sick symptoms all he does endorse tobacco abuse I counseled him on this.  He has no history of diabetes or CKD.    History reviewed. No pertinent past medical history.   Physical Exam  Triage Vital Signs: ED Triage Vitals [05/26/22 1119]  Enc Vitals Group     BP (!) 139/103     Pulse Rate 70     Resp 18     Temp 98.7 F (37.1 C)     Temp Source Oral     SpO2 97 %     Weight 140 lb (63.5 kg)     Height 5' 1"  (1.549 m)     Head Circumference      Peak Flow      Pain Score 10     Pain Loc      Pain Edu?      Excl. in [place]?     Most recent vital signs: Vitals:   05/26/22 1119  BP: (!) 139/103  Pulse: 70  Resp: 18  Temp: 98.7 F (37.1 C)  SpO2: 97%    General: Awake, no distress.  CV:  Good peripheral perfusion.  Resp:  Normal effort.  Abd:  No distention.  Other:  Patient has some swelling tenderness along his left fourth digit.  He has severe pain on range of motion at the DIP but is able to range at the PIP and MCP.  No other significant swelling or pain on range of motion of the other digits.  Less than  2-second capillary refill in the digits.  2+ radial pulse.  Sensation is intact in distribution of radial ulnar and median nerves.  No penetrating injury.   ED Results / Procedures / Treatments  Labs (all labs ordered are listed, but only abnormal results are displayed) Labs Reviewed  BASIC METABOLIC PANEL - Abnormal; Notable for the following components:      Result Value   Glucose, Bld 102 (*)    All other components within normal limits  SEDIMENTATION RATE - Abnormal; Notable for the following components:   Sed Rate 16 (*)    All other components within normal limits  CBC WITH DIFFERENTIAL/PLATELET  URIC ACID  C-REACTIVE PROTEIN     EKG    RADIOLOGY  X-ray of the left hand my interpretation negative for acute fracture or dislocation.  I also reviewed radiology's interpretation and agree their findings.   PROCEDURES:  Critical Care performed: No  Procedures    MEDICATIONS ORDERED IN  ED: Medications  predniSONE (DELTASONE) tablet 40 mg (has no administration in time range)  colchicine tablet 1.2 mg (has no administration in time range)  acetaminophen (TYLENOL) tablet 1,000 mg (has no administration in time range)  pantoprazole (PROTONIX) EC tablet 40 mg (has no administration in time range)     IMPRESSION / MDM / ASSESSMENT AND PLAN / ED COURSE  I reviewed the triage vital signs and the nursing notes. Patient's presentation is most consistent with acute presentation with potential threat to life or bodily function.                               Differential diagnosis includes, but is not limited to gout, cellulitis, occult trauma.  There is no history of any trauma patient recalls and there is no evidence of any significant redness or penetrating injury on exam and have a lower suspicion for tenosynovitis at this time.  No evidence of a felon or paronychia.  X-rays negative for acute fracture dislocation. CBC without leukocytosis or acute anemia.  BMP shows no  significant electrolyte or metabolic derangements.  ESR is 16.  Uric acid level 5.1.  I suspect likely acute gout flare.  Counseled on tobacco cessation and avoiding alcohol and meats.  We will start patient on Protonix given he reports some left upper quadrant discomfort as he is taking Profen without food.  Discussed only taking this with food.  We will also give patient a loading dose of colchicine in the follow-up dose for later this afternoon in addition to a short course of prednisone.  Discussed returning for any new or worsening symptoms.  Discharged in stable condition.  Strict and precautions advised and discussed.      FINAL CLINICAL IMPRESSION(S) / ED DIAGNOSES   Final diagnoses:  Acute gout of left hand, unspecified cause     Rx / DC Orders   ED Discharge Orders          Ordered    colchicine 0.6 MG tablet  Daily        05/26/22 1207    predniSONE (DELTASONE) 10 MG tablet  Daily        05/26/22 1207    pantoprazole (PROTONIX) 40 MG tablet  Daily        05/26/22 1207             Note:  This document was prepared using Dragon voice recognition software and may include unintentional dictation errors.   Lucrezia Starch, MD 05/26/22 [phone]

## 2022-05-26 NOTE — ED Triage Notes (Signed)
Pt reports he woke up this am with throbbing left ring finger pain and swelling. Denies injury. Reports hx of gout.

## 2023-07-22 ENCOUNTER — Other Ambulatory Visit: Payer: Self-pay

## 2023-07-22 MED ORDER — SILDENAFIL CITRATE 20 MG PO TABS
20.0000 mg | ORAL_TABLET | ORAL | 1 refills | Status: AC | PRN
  Filled 2023-07-22: qty 30, 60d supply, fill #0

## 2023-07-27 ENCOUNTER — Emergency Department (HOSPITAL_COMMUNITY)
Admission: EM | Admit: 2023-07-27 | Discharge: 2023-07-27 | Disposition: A | Discharge status: Home / Self Care | Payer: Managed Care, Other (non HMO) | Attending: Emergency Medicine | Admitting: Emergency Medicine

## 2023-07-27 ENCOUNTER — Other Ambulatory Visit: Payer: Self-pay

## 2023-07-27 DIAGNOSIS — F17210 Nicotine dependence, cigarettes, uncomplicated: Secondary | ICD-10-CM | POA: Diagnosis not present

## 2023-07-27 DIAGNOSIS — R55 Syncope and collapse: Secondary | ICD-10-CM | POA: Diagnosis present

## 2023-07-27 DIAGNOSIS — R001 Bradycardia, unspecified: Secondary | ICD-10-CM | POA: Insufficient documentation

## 2023-07-27 DIAGNOSIS — R110 Nausea: Secondary | ICD-10-CM | POA: Insufficient documentation

## 2023-07-27 LAB — CBC WITH DIFFERENTIAL/PLATELET
Abs Immature Granulocytes: 0.02 10*3/uL (ref 0.00–0.07)
Basophils Absolute: 0 10*3/uL (ref 0.0–0.1)
Basophils Relative: 1 %
Eosinophils Absolute: 0.1 10*3/uL (ref 0.0–0.5)
Eosinophils Relative: 1 %
HCT: 37.8 % — ABNORMAL LOW (ref 39.0–52.0)
Hemoglobin: 12.9 g/dL — ABNORMAL LOW (ref 13.0–17.0)
Immature Granulocytes: 0 %
Lymphocytes Relative: 16 %
Lymphs Abs: 1.3 10*3/uL (ref 0.7–4.0)
MCH: 31.5 pg (ref 26.0–34.0)
MCHC: 34.1 g/dL (ref 30.0–36.0)
MCV: 92.4 fL (ref 80.0–100.0)
Monocytes Absolute: 0.8 10*3/uL (ref 0.1–1.0)
Monocytes Relative: 10 %
Neutro Abs: 5.6 10*3/uL (ref 1.7–7.7)
Neutrophils Relative %: 72 %
Platelets: 213 10*3/uL (ref 150–400)
RBC: 4.09 MIL/uL — ABNORMAL LOW (ref 4.22–5.81)
RDW: 14.6 % (ref 11.5–15.5)
WBC: 7.8 10*3/uL (ref 4.0–10.5)
nRBC: 0 % (ref 0.0–0.2)

## 2023-07-27 LAB — BASIC METABOLIC PANEL
Anion gap: 9 (ref 5–15)
BUN: 11 mg/dL (ref 6–20)
CO2: 26 mmol/L (ref 22–32)
Calcium: 8.8 mg/dL — ABNORMAL LOW (ref 8.9–10.3)
Chloride: 102 mmol/L (ref 98–111)
Creatinine, Ser: 0.98 mg/dL (ref 0.61–1.24)
GFR, Estimated: >60 mL/min (ref >60)
Glucose, Bld: 90 mg/dL (ref 70–99)
Potassium: 3.1 mmol/L — ABNORMAL LOW (ref 3.5–5.1)
Sodium: 137 mmol/L (ref 135–145)

## 2023-07-27 NOTE — ED Provider Notes (Signed)
[place] [facility] AT [facility] Provider Note   CSN: [id number] Arrival date & time: 07/27/23  1146     History  Chief Complaint  Patient presents with   syncope    Pt coming from work via EMS with a witnessed syncope. Pt was found by EMS to be cool, clammy and diaphoretic. Per witnesses pt did not hit head. No thinners. Pt denies cp/shob.     Ivan Coleman is a 42 y.o. male.  HPI   Patient is a 42 year old male reports that he is otherwise healthy, he does take 1 pack of cigarettes per day, he works in a warehouse setting, he never eats breakfast, he was at work today in his usual state of health when he started to feel lightheaded, clammy, nauseated and had a near syncopal event.  This lasted 15 to 20 minutes and then seem to resolve.  He is not back to his baseline.  He denies any vomiting despite the nausea, denies chest pain, he thinks he might of had a short period of palpitations but is not having it at this time.  He has had no fevers or chills, no coughing or shortness of breath, no headache or blurred vision.  Paramedics transported the patient  Home Medications Prior to Admission medications   Medication Sig Start Date End Date Taking? Authorizing Provider  carbamide peroxide (DEBROX) 6.5 % OTIC solution Place 5 drops into both ears 2 (two) times daily. 10/04/20   Willy Eddy, MD  colchicine 0.6 MG tablet Take 1 tablet (0.6 mg total) by mouth daily for 1 dose. 05/26/22 05/27/22  Gilles Chiquito, MD  pantoprazole (PROTONIX) 40 MG tablet Take 1 tablet (40 mg total) by mouth daily for 14 days. 05/26/22 06/09/22  Gilles Chiquito, MD  sildenafil (REVATIO) 20 MG tablet Take 1 tablet by mouth as needed an hour before sex. Do not take more than 1 time in 48 hours. This is not a daily medicine. 07/22/23         Allergies    Patient has no known allergies.    Review of Systems   Review of Systems  Gastrointestinal:  Positive for nausea.  Neurological:   Positive for syncope.    Physical Exam Updated Vital Signs BP 107/75   Pulse 67   Temp 97.7 F (36.5 C) (Oral)   Resp 20   Ht 1.575 m (5\' 2" )   Wt 61.2 kg   SpO2 97%   BMI 24.69 kg/m  Physical Exam Vitals and nursing note reviewed.  Constitutional:      General: He is not in acute distress.    Appearance: He is well-developed.  HENT:     Head: Normocephalic and atraumatic.     Mouth/Throat:     Pharynx: No oropharyngeal exudate.  Eyes:     General: No scleral icterus.       Right eye: No discharge.        Left eye: No discharge.     Conjunctiva/sclera: Conjunctivae normal.     Pupils: Pupils are equal, round, and reactive to light.  Neck:     Thyroid: No thyromegaly.     Vascular: No JVD.  Cardiovascular:     Rate and Rhythm: Normal rate and regular rhythm.     Heart sounds: Normal heart sounds. No murmur heard.    No friction rub. No gallop.  Pulmonary:     Effort: Pulmonary effort is normal. No respiratory distress.  Breath sounds: Normal breath sounds. No wheezing or rales.  Abdominal:     General: Bowel sounds are normal. There is no distension.     Palpations: Abdomen is soft. There is no mass.     Tenderness: There is no abdominal tenderness.  Musculoskeletal:        General: No tenderness. Normal range of motion.     Cervical back: Normal range of motion and neck supple.     Right lower leg: No edema.     Left lower leg: No edema.  Lymphadenopathy:     Cervical: No cervical adenopathy.  Skin:    General: Skin is warm and dry.     Findings: No erythema or rash.  Neurological:     Mental Status: He is alert.     Coordination: Coordination normal.     Comments: Speech is clear, cranial nerves III through XII are intact, memory is intact, strength is normal in all 4 extremities including grips, sensation is intact to light touch and pinprick in all 4 extremities. Coordination as tested by finger-nose-finger is normal, no limb ataxia. Normal gait, normal  reflexes at the patellar tendons bilaterally  Psychiatric:        Behavior: Behavior normal.     ED Results / Procedures / Treatments   Labs (all labs ordered are listed, but only abnormal results are displayed) Labs Reviewed  CBC WITH DIFFERENTIAL/PLATELET - Abnormal; Notable for the following components:      Result Value   RBC 4.09 (*)    Hemoglobin 12.9 (*)    HCT 37.8 (*)    All other components within normal limits  BASIC METABOLIC PANEL - Abnormal; Notable for the following components:   Potassium 3.1 (*)    Calcium 8.8 (*)    All other components within normal limits    EKG EKG Interpretation Date/Time:  Wednesday July 27 2023 11:58:59 EDT Ventricular Rate:  54 PR Interval:  147 QRS Duration:  86 QT Interval:  417 QTC Calculation: 396 R Axis:   35  Text Interpretation: Sinus rhythm Borderline repolarization abnormality Confirmed by Eber Hong ([id number]) on 07/27/2023 12:41:30 PM  Radiology No results found.  Procedures Procedures    Medications Ordered in ED Medications - No data to display  ED Course/ Medical Decision Making/ A&P                                 Medical Decision Making Amount and/or Complexity of Data Reviewed Labs: ordered.   EKG shows sinus bradycardia but no other acute findings.  Patient's exam is unremarkable, sounds like a vagal event but he did have some palpitation so we will keep him on a heart monitor.  Check labs, potassium, anemia, patient agreeable.  Labs unremarkable, vital sign unremarkable, EKG reassuring, this patient is well-appearing stable for discharge.  He was kept on a cardiac monitor without any signs of arrhythmia  I have discussed with the patient at the bedside the results, and the meaning of these results.  They have expressed her understanding to the need for follow-up with primary care physician        Final Clinical Impression(s) / ED Diagnoses Final diagnoses:  Vasovagal syncope    Rx /  DC Orders ED Discharge Orders     None         Eber Hong, MD 07/27/23 1433

## 2023-07-27 NOTE — Discharge Instructions (Signed)
Plenty of fluids, stay out of work today, ER for worsening symptoms otherwise see your doctor within 3 days for recheck

## 2023-07-27 NOTE — ED Notes (Signed)
Pt is a&ox4, warm and dry to touch. Pt currently denies cp/shob. Says he did not have anything to eat this AM. Pt changed into a gown, attached to monitor/vitals. Side rails up x 2, call light with pt. Family/friend at the bedside.
